# Patient Record
Sex: Male | Born: 1935 | Race: White | Hispanic: No | Marital: Married | Smoking: Never smoker
Health system: Southern US, Community
[De-identification: ages and names within clinical notes are randomized; demographics above are authoritative.]

## PROBLEM LIST (undated history)

## (undated) DIAGNOSIS — J449 Chronic obstructive pulmonary disease, unspecified: Secondary | ICD-10-CM

## (undated) DIAGNOSIS — G610 Guillain-Barre syndrome: Secondary | ICD-10-CM

## (undated) DIAGNOSIS — I10 Essential (primary) hypertension: Secondary | ICD-10-CM

## (undated) HISTORY — PX: APPENDECTOMY: SHX54

---

## 2009-10-13 ENCOUNTER — Ambulatory Visit: Payer: Self-pay | Admitting: Diagnostic Radiology

## 2009-10-13 ENCOUNTER — Emergency Department (HOSPITAL_BASED_OUTPATIENT_CLINIC_OR_DEPARTMENT_OTHER): Admission: EM | Admit: 2009-10-13 | Discharge: 2009-10-13 | Payer: Self-pay | Admitting: Emergency Medicine

## 2011-07-26 IMAGING — CR DG HIP (WITH OR WITHOUT PELVIS) 2-3V*L*
3 series · 3 of 3 positions shown · non-contrast
Comparison: None.

CLINICAL DATA: Wrist injury.  Fall.  Left hip pain.

LEFT HIP - COMPLETE 2+ VIEW

[t pelvis a.p.]
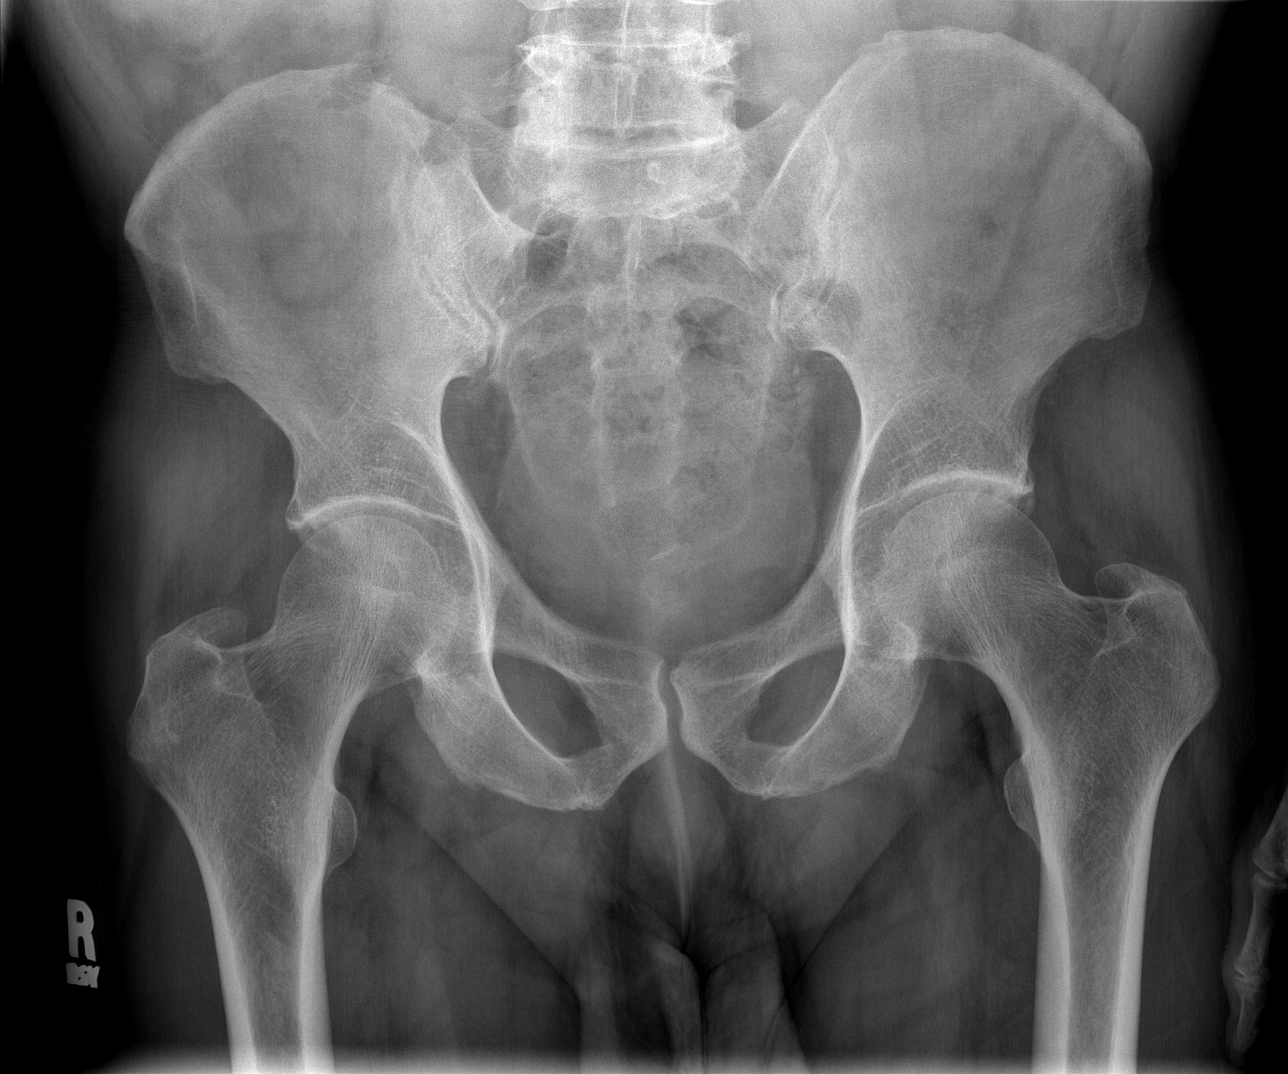

[t hip frog leg left]
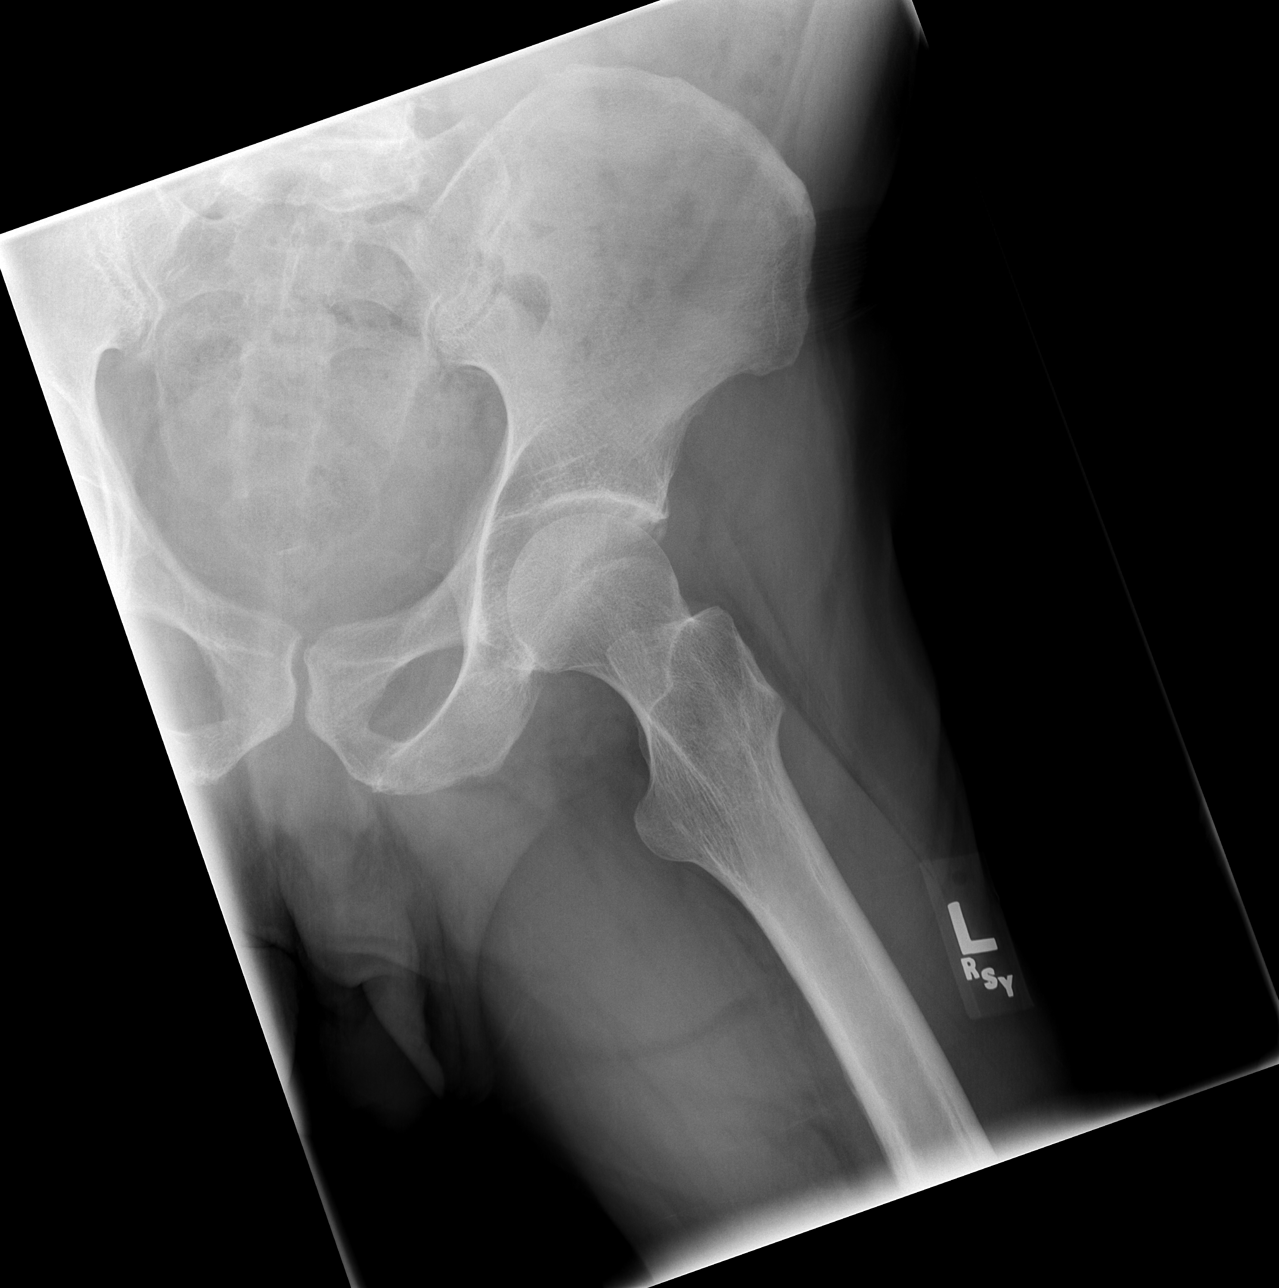

[t hip ap left]
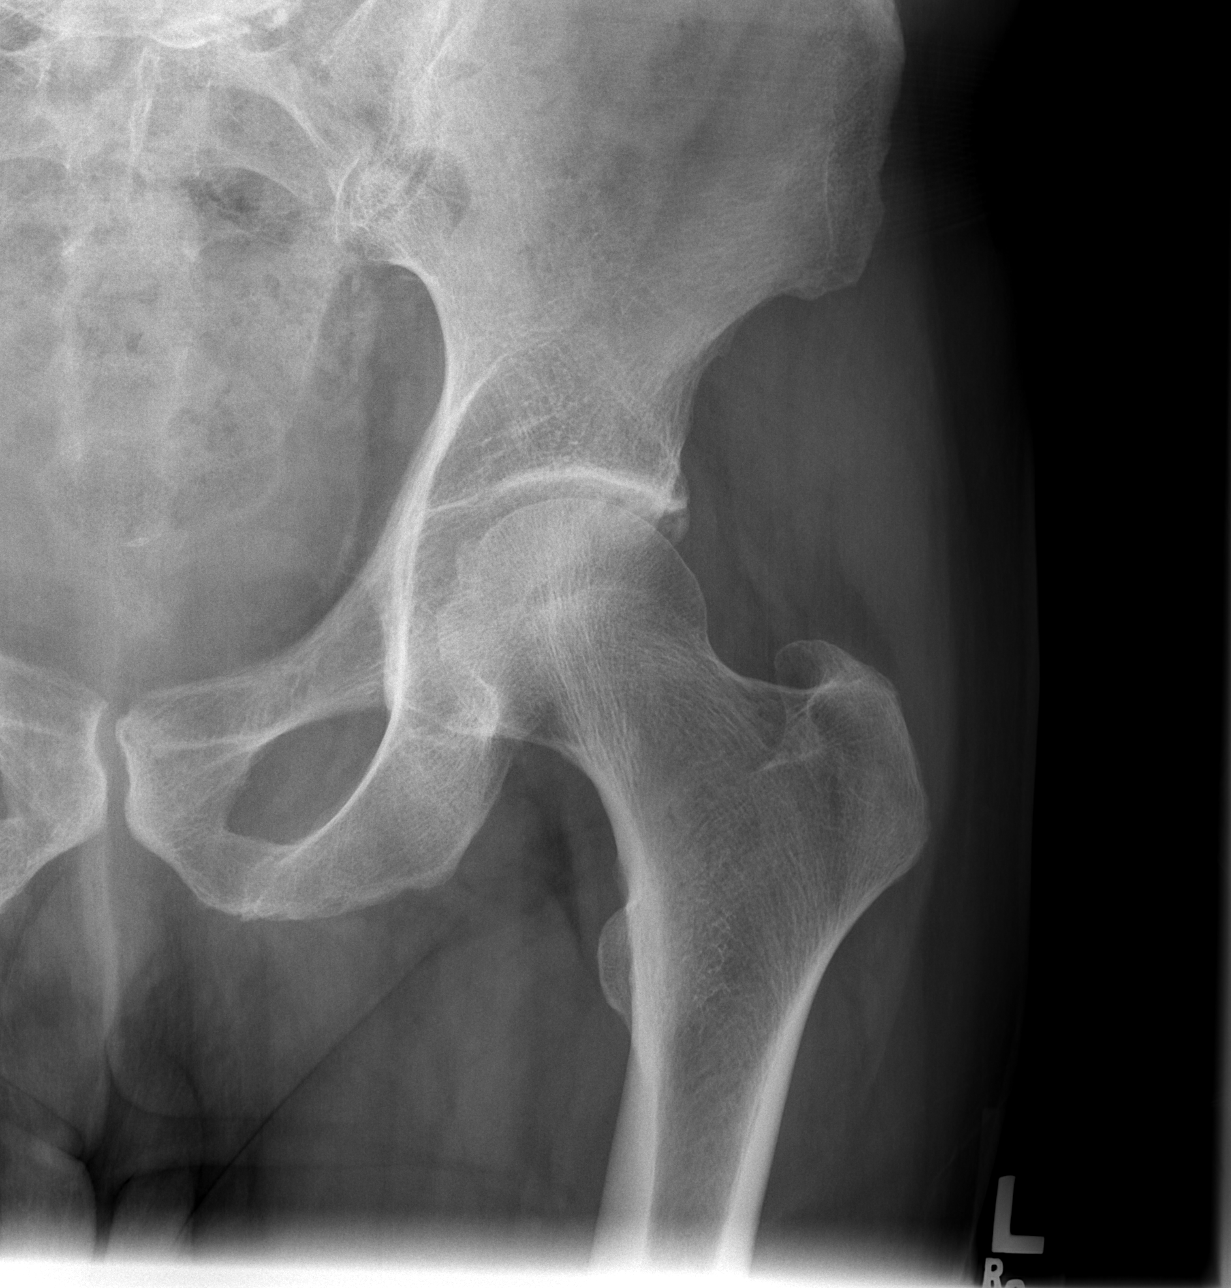

[3 of 3 positions shown; findings below may reference images not displayed]

FINDINGS: Degenerative changes are seen in the lower spine and both
hips.  There is no evidence for acute fracture or dislocation.
Femoral head is located within the acetabulum.
IMPRESSION: No evidence for acute  abnormality.

## 2017-06-17 ENCOUNTER — Other Ambulatory Visit: Payer: Self-pay

## 2017-06-17 ENCOUNTER — Encounter (HOSPITAL_BASED_OUTPATIENT_CLINIC_OR_DEPARTMENT_OTHER): Payer: Self-pay | Admitting: *Deleted

## 2017-06-17 ENCOUNTER — Emergency Department (HOSPITAL_BASED_OUTPATIENT_CLINIC_OR_DEPARTMENT_OTHER)
Admission: EM | Admit: 2017-06-17 | Discharge: 2017-06-18 | Disposition: A | Discharge status: Home / Self Care | Payer: Medicare Other | Attending: Emergency Medicine | Admitting: Emergency Medicine

## 2017-06-17 DIAGNOSIS — J449 Chronic obstructive pulmonary disease, unspecified: Secondary | ICD-10-CM | POA: Insufficient documentation

## 2017-06-17 DIAGNOSIS — Z79899 Other long term (current) drug therapy: Secondary | ICD-10-CM | POA: Insufficient documentation

## 2017-06-17 DIAGNOSIS — I10 Essential (primary) hypertension: Secondary | ICD-10-CM | POA: Insufficient documentation

## 2017-06-17 HISTORY — DX: Essential (primary) hypertension: I10

## 2017-06-17 HISTORY — DX: Chronic obstructive pulmonary disease, unspecified: J44.9

## 2017-06-17 HISTORY — DX: Guillain-Barre syndrome: G61.0

## 2017-06-17 LAB — CBC WITH DIFFERENTIAL/PLATELET
BASOS ABS: 0 10*3/uL (ref 0.0–0.1)
Basophils Relative: 0 %
Eosinophils Absolute: 0.3 10*3/uL (ref 0.0–0.7)
Eosinophils Relative: 3 %
HEMATOCRIT: 39.7 % (ref 39.0–52.0)
HEMOGLOBIN: 14.1 g/dL (ref 13.0–17.0)
Lymphocytes Relative: 26 %
Lymphs Abs: 2.4 10*3/uL (ref 0.7–4.0)
MCH: 32.4 pg (ref 26.0–34.0)
MCHC: 35.5 g/dL (ref 30.0–36.0)
MCV: 91.3 fL (ref 78.0–100.0)
MONOS PCT: 11 %
Monocytes Absolute: 1 10*3/uL (ref 0.1–1.0)
NEUTROS ABS: 5.5 10*3/uL (ref 1.7–7.7)
NEUTROS PCT: 60 %
Platelets: 217 10*3/uL (ref 150–400)
RBC: 4.35 MIL/uL (ref 4.22–5.81)
RDW: 13.2 % (ref 11.5–15.5)
WBC: 9.3 10*3/uL (ref 4.0–10.5)

## 2017-06-17 LAB — COMPREHENSIVE METABOLIC PANEL
ALBUMIN: 4.1 g/dL (ref 3.5–5.0)
ALK PHOS: 47 U/L (ref 38–126)
ALT: 26 U/L (ref 17–63)
AST: 30 U/L (ref 15–41)
Anion gap: 9 (ref 5–15)
BILIRUBIN TOTAL: 0.5 mg/dL (ref 0.3–1.2)
BUN: 18 mg/dL (ref 6–20)
CALCIUM: 8.9 mg/dL (ref 8.9–10.3)
CO2: 24 mmol/L (ref 22–32)
CREATININE: 1.03 mg/dL (ref 0.61–1.24)
Chloride: 106 mmol/L (ref 101–111)
GFR calc Af Amer: >60 mL/min (ref >60)
GFR calc non Af Amer: >60 mL/min (ref >60)
Glucose, Bld: 105 mg/dL — ABNORMAL HIGH (ref 65–99)
Potassium: 3.9 mmol/L (ref 3.5–5.1)
Sodium: 139 mmol/L (ref 135–145)
Total Protein: 7.4 g/dL (ref 6.5–8.1)

## 2017-06-17 NOTE — ED Triage Notes (Addendum)
States he started checking his BP this afternoon and it was 213/90. Headache for an hour. His BP medication dosage was doubled 2 weeks ago because his BP was running high.

## 2017-06-18 MED ORDER — ACETAMINOPHEN 325 MG PO TABS
650.0000 mg | ORAL_TABLET | Freq: Once | ORAL | Status: AC
  Administered 2017-06-18: 650 mg via ORAL
  Filled 2017-06-18: qty 2

## 2017-06-18 NOTE — ED Provider Notes (Signed)
[facility] Provider Note: Lowella Dell, MD, FACEP  CSN: [id number] MRN: [id number] ARRIVAL: 06/17/17 at 2050 ROOM: [facility]   CHIEF COMPLAINT  Hypertension   HISTORY OF PRESENT ILLNESS  06/18/17 12:25 AM Oscar Berry is a 83 y.o. male with a history of hypertension.  He took his blood pressure throughout the day yesterday and found to be as high as 213/90.  He became more more concerned about his blood pressure throughout the day.  He is on losartan and had his dose increased about 2 weeks ago.  About 7 PM he developed a gradual onset of a headache which she rates as a 4 out of 10.  He has not taken anything for the headache.  He denies any numbness, weakness, chest pain or shortness of breath.  His blood pressure has improved while in the emergency department, most recently 168/84.   Past Medical History:  Diagnosis Date  . COPD (chronic obstructive pulmonary disease) (HCC)   . Guillain Barr syndrome (HCC)   . Hypertension     Past Surgical History:  Procedure Laterality Date  . APPENDECTOMY      No family history on file.  Social History   Tobacco Use  . Smoking status: Never Smoker  . Smokeless tobacco: Never Used  Substance Use Topics  . Alcohol use: Yes  . Drug use: No    Prior to Admission medications   Medication Sig Start Date End Date Taking? Authorizing Provider  escitalopram (LEXAPRO) 10 MG tablet Take 10 mg by mouth daily.   Yes [provider]  LOSARTAN POTASSIUM PO Take by mouth.   Yes [provider]  TRAZODONE HCL PO Take by mouth.   Yes [provider]    Allergies Patient has no known allergies.   REVIEW OF SYSTEMS  Negative except as noted here or in the History of Present Illness.   PHYSICAL EXAMINATION  Initial Vital Signs Blood pressure (!) 199/96, pulse (!) 58, temperature 98.1 F (36.7 C), temperature source Oral, resp. rate 18, height 5\' 10"  (1.778 m), weight 81.6 kg (180 lb), SpO2 94  %.  Examination General: Well-developed, well-nourished male in no acute distress; appearance consistent with age of record HENT: normocephalic; atraumatic Eyes: pupils equal, round and reactive to light; extraocular muscles intact Neck: supple Heart: regular rate and rhythm Lungs: clear to auscultation bilaterally Abdomen: soft; nondistended; nontender; bowel sounds present Extremities: No deformity; full range of motion; pulses normal Neurologic: Awake, alert and oriented; motor function intact in all extremities and symmetric; no facial droop Skin: Warm and dry Psychiatric: Normal mood and affect   RESULTS  Summary of this visit's results, reviewed by myself:   EKG Interpretation  Date/Time:    Ventricular Rate:    PR Interval:    QRS Duration:   QT Interval:    QTC Calculation:   R Axis:     Text Interpretation:        Laboratory Studies: Results for orders placed or performed during the hospital encounter of 06/17/17 (from the past 24 hour(s))  CBC with Differential     Status: None   Collection Time: 06/17/17 11:30 PM  Result Value Ref Range   WBC 9.3 4.0 - 10.5 K/uL   RBC 4.35 4.22 - 5.81 MIL/uL   Hemoglobin 14.1 13.0 - 17.0 g/dL   HCT 78.2 95.6 - 21.3 %   MCV 91.3 78.0 - 100.0 fL   MCH 32.4 26.0 - 34.0 pg   MCHC 35.5 30.0 -  36.0 g/dL   RDW 16.113.2 09.611.5 - 04.515.5 %   Platelets 217 150 - 400 K/uL   Neutrophils Relative % 60 %   Neutro Abs 5.5 1.7 - 7.7 K/uL   Lymphocytes Relative 26 %   Lymphs Abs 2.4 0.7 - 4.0 K/uL   Monocytes Relative 11 %   Monocytes Absolute 1.0 0.1 - 1.0 K/uL   Eosinophils Relative 3 %   Eosinophils Absolute 0.3 0.0 - 0.7 K/uL   Basophils Relative 0 %   Basophils Absolute 0.0 0.0 - 0.1 K/uL  Comprehensive metabolic panel     Status: Abnormal   Collection Time: 06/17/17 11:30 PM  Result Value Ref Range   Sodium 139 135 - 145 mmol/L   Potassium 3.9 3.5 - 5.1 mmol/L   Chloride 106 101 - 111 mmol/L   CO2 24 22 - 32 mmol/L   Glucose, Bld  105 (H) 65 - 99 mg/dL   BUN 18 6 - 20 mg/dL   Creatinine, Ser 4.091.03 0.61 - 1.24 mg/dL   Calcium 8.9 8.9 - 81.110.3 mg/dL   Total Protein 7.4 6.5 - 8.1 g/dL   Albumin 4.1 3.5 - 5.0 g/dL   AST 30 15 - 41 U/L   ALT 26 17 - 63 U/L   Alkaline Phosphatase 47 38 - 126 U/L   Total Bilirubin 0.5 0.3 - 1.2 mg/dL   GFR calc non Af Amer >60 >60 mL/min   GFR calc Af Amer >60 >60 mL/min   Anion gap 9 5 - 15   Imaging Studies: No results found.  ED COURSE  Nursing notes and initial vitals signs, including pulse oximetry, reviewed.  Vitals:   06/17/17 2100 06/17/17 2105  BP:  (!) 199/96  Pulse:  (!) 58  Resp:  18  Temp:  98.1 F (36.7 C)  TempSrc:  Oral  SpO2:  94%  Weight: 81.6 kg (180 lb)   Height: 5\' 10"  (1.778 m)    Patient's lab work is normal and he is not having any evidence of endorgan damage.  He was advised that acute intervention is not indicated at this time.  He was advised to keep a log of his blood pressures but not to become overly concerned if he has an incidental elevated blood pressure especially in the absence of symptoms.  He was advised to contact his primary care physician regarding his concerns.  PROCEDURES    ED DIAGNOSES     ICD-10-CM   1. Hypertension not at goal [facility]        Arryanna Holquin, Jonny RuizJohn, MD 06/18/17 [phone]
